# Patient Record
Sex: Male | Born: 1990 | Race: White | Hispanic: No | Marital: Single | Smoking: Never smoker
Health system: Southern US, Community
[De-identification: ages and names within clinical notes are randomized; demographics above are authoritative.]

## PROBLEM LIST (undated history)

## (undated) DIAGNOSIS — F909 Attention-deficit hyperactivity disorder, unspecified type: Secondary | ICD-10-CM

## (undated) HISTORY — PX: EXTERNAL EAR SURGERY: SHX627

---

## 2013-03-17 ENCOUNTER — Encounter (HOSPITAL_BASED_OUTPATIENT_CLINIC_OR_DEPARTMENT_OTHER): Payer: Self-pay | Admitting: Emergency Medicine

## 2013-03-17 ENCOUNTER — Emergency Department (HOSPITAL_BASED_OUTPATIENT_CLINIC_OR_DEPARTMENT_OTHER): Payer: BC Managed Care – PPO

## 2013-03-17 ENCOUNTER — Emergency Department (HOSPITAL_BASED_OUTPATIENT_CLINIC_OR_DEPARTMENT_OTHER)
Admission: EM | Admit: 2013-03-17 | Discharge: 2013-03-17 | Disposition: A | Discharge status: Home / Self Care | Payer: BC Managed Care – PPO | Attending: Emergency Medicine | Admitting: Emergency Medicine

## 2013-03-17 DIAGNOSIS — Y929 Unspecified place or not applicable: Secondary | ICD-10-CM | POA: Insufficient documentation

## 2013-03-17 DIAGNOSIS — X500XXA Overexertion from strenuous movement or load, initial encounter: Secondary | ICD-10-CM | POA: Insufficient documentation

## 2013-03-17 DIAGNOSIS — S93409A Sprain of unspecified ligament of unspecified ankle, initial encounter: Secondary | ICD-10-CM

## 2013-03-17 DIAGNOSIS — Y9389 Activity, other specified: Secondary | ICD-10-CM | POA: Insufficient documentation

## 2013-03-17 DIAGNOSIS — F909 Attention-deficit hyperactivity disorder, unspecified type: Secondary | ICD-10-CM | POA: Insufficient documentation

## 2013-03-17 DIAGNOSIS — Z79899 Other long term (current) drug therapy: Secondary | ICD-10-CM | POA: Insufficient documentation

## 2013-03-17 HISTORY — DX: Attention-deficit hyperactivity disorder, unspecified type: F90.9

## 2013-03-17 MED ORDER — HYDROCODONE-ACETAMINOPHEN 5-325 MG PO TABS: 2.0000 | ORAL_TABLET | ORAL | Status: AC | PRN

## 2013-03-17 NOTE — Discharge Instructions (Signed)

## 2013-03-17 NOTE — ED Provider Notes (Signed)
CSN: [id number]     Arrival date & time 03/17/13  1245 History   First MD Initiated Contact with Patient 03/17/13 1306     Chief Complaint  Patient presents with  . Ankle Injury   HPI  Patient presents after inverting his ankle and onto the step. Felt a pop and has pain and swelling laterally. Walks with a marked limp. No previous ankle injuries.  Past Medical History  Diagnosis Date  . ADHD (attention deficit hyperactivity disorder)    Past Surgical History  Procedure Laterality Date  . External ear surgery     No family history on file. History  Substance Use Topics  . Smoking status: Never Smoker   . Smokeless tobacco: Not on file  . Alcohol Use: No    Review of Systems  Musculoskeletal:       Pain and swelling to the right lateral ankle. No pain in the knee I am lateral fibula. Nontender over the head of the fifth metatarsal.    Allergies  Review of patient's allergies indicates no known allergies.  Home Medications   Current Outpatient Rx  Name  Route  Sig  Dispense  Refill  . amphetamine-dextroamphetamine (ADDERALL) 20 MG tablet   Oral   Take 20 mg by mouth daily.         Marland Kitchen. HYDROcodone-acetaminophen (NORCO/VICODIN) 5-325 MG per tablet   Oral   Take 2 tablets by mouth every 4 (four) hours as needed.   6 tablet   0    BP 144/55  Pulse 72  Temp(Src) 98.2 F (36.8 C) (Oral)  Resp 16  Ht 6\' 3"  (1.905 m)  Wt 220 lb (99.791 kg)  BMI 27.50 kg/m2  SpO2 99% Physical Exam  Musculoskeletal:       Feet:    ED Course  Procedures (including critical care time) Labs Review Labs Reviewed - No data to display Imaging Review Dg Ankle Complete Right  03/17/2013   CLINICAL DATA:  Pain post trauma  EXAM: RIGHT ANKLE - COMPLETE 3+ VIEW  COMPARISON:  None.  FINDINGS: Frontal, oblique, and lateral views were obtained. There is swelling laterally. No fracture or effusion. Ankle mortise appears intact.  IMPRESSION: Swelling laterally.  No fracture.  Mortise intact.    Electronically Signed   By: Bretta BangWilliam  Woodruff M.D.   On: 03/17/2013 13:43    EKG Interpretation   None       MDM   1. Ankle sprain    Soft tissue swelling noted. X-ray shows no fracture. Placed in an Ace wrap. Plan will be non-weight bearing for 2-3 days. Ice elevation Ace wrap anti-inflammatories. Given numbers 6 Vicodin as a prescription for pain today. Slowly increase weightbearing as tolerated. Nonsurgical ortho followup if not improving after one week.   Rolland PorterMark Monae Topping, MD 03/17/13 1400

## 2013-03-17 NOTE — ED Notes (Signed)
Tripped/twisted right ankle approx 10am-swelling noted

## 2013-03-17 NOTE — ED Notes (Signed)
Patient given proper crutches education, patient performed return demonstration.

## 2014-12-20 IMAGING — CR DG ANKLE COMPLETE 3+V*R*
3 series · 3 of 3 positions shown · non-contrast
Comparison: None.

CLINICAL DATA: Pain post trauma

EXAM:
RIGHT ANKLE - COMPLETE 3+ VIEW

[t ankle joint ap right]
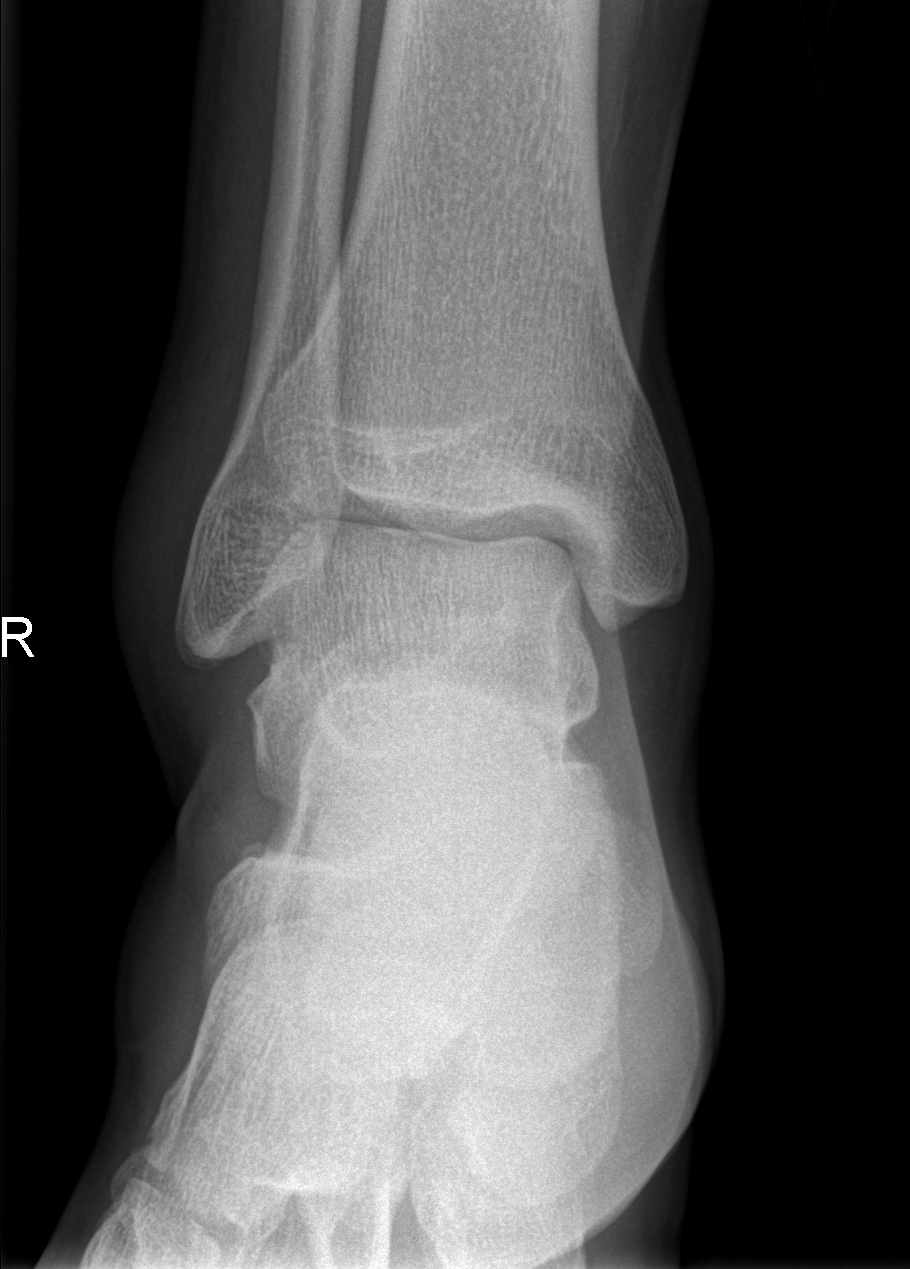

[t ankle joint oblique right]
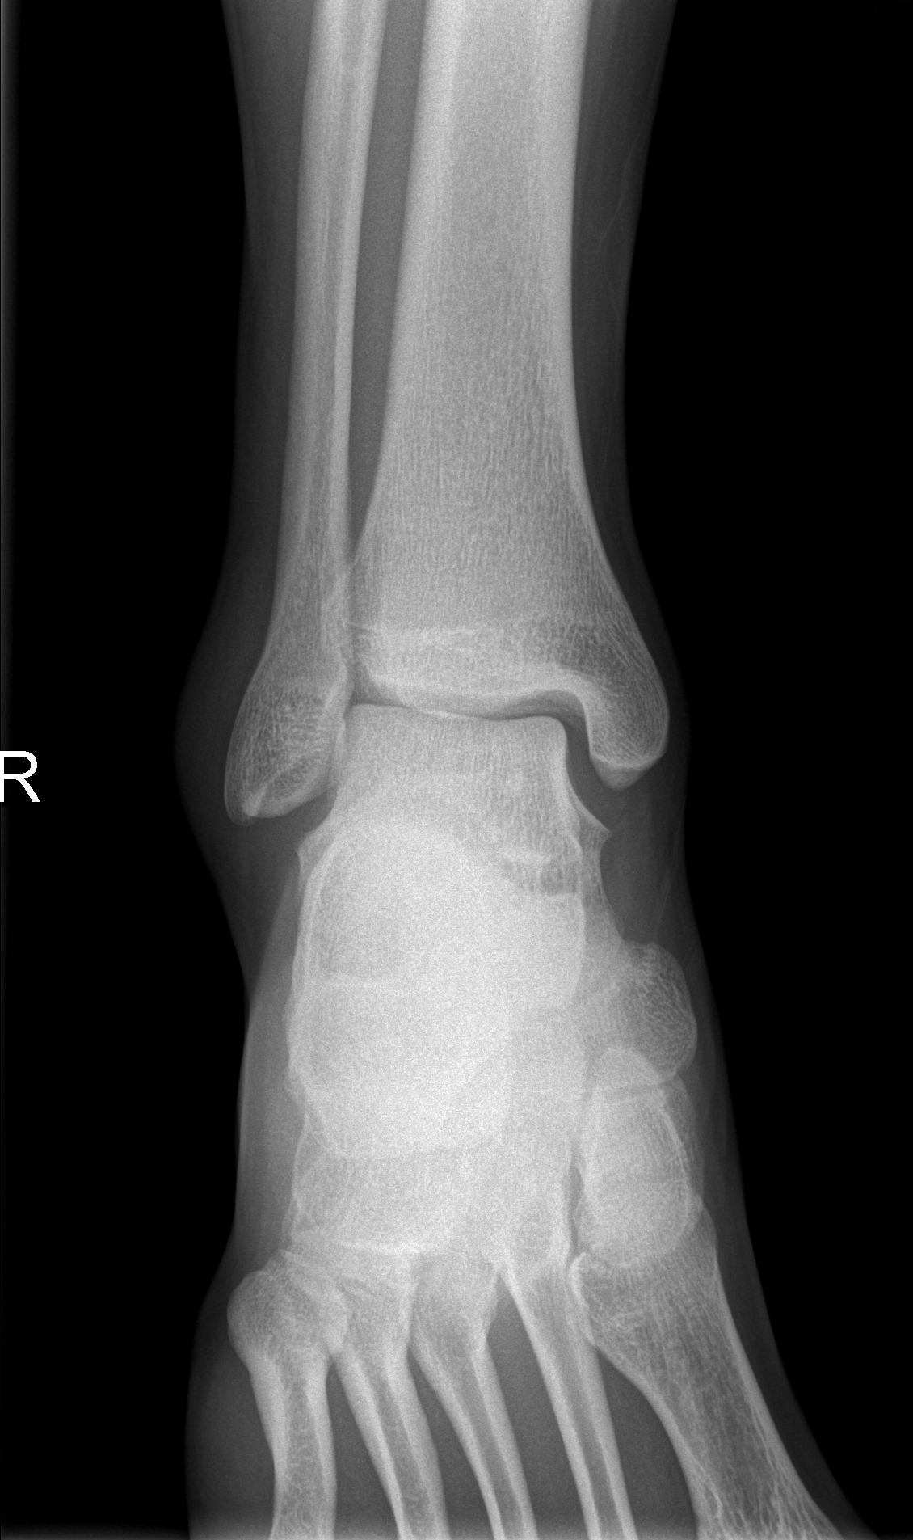

[t ankle joint lat right]
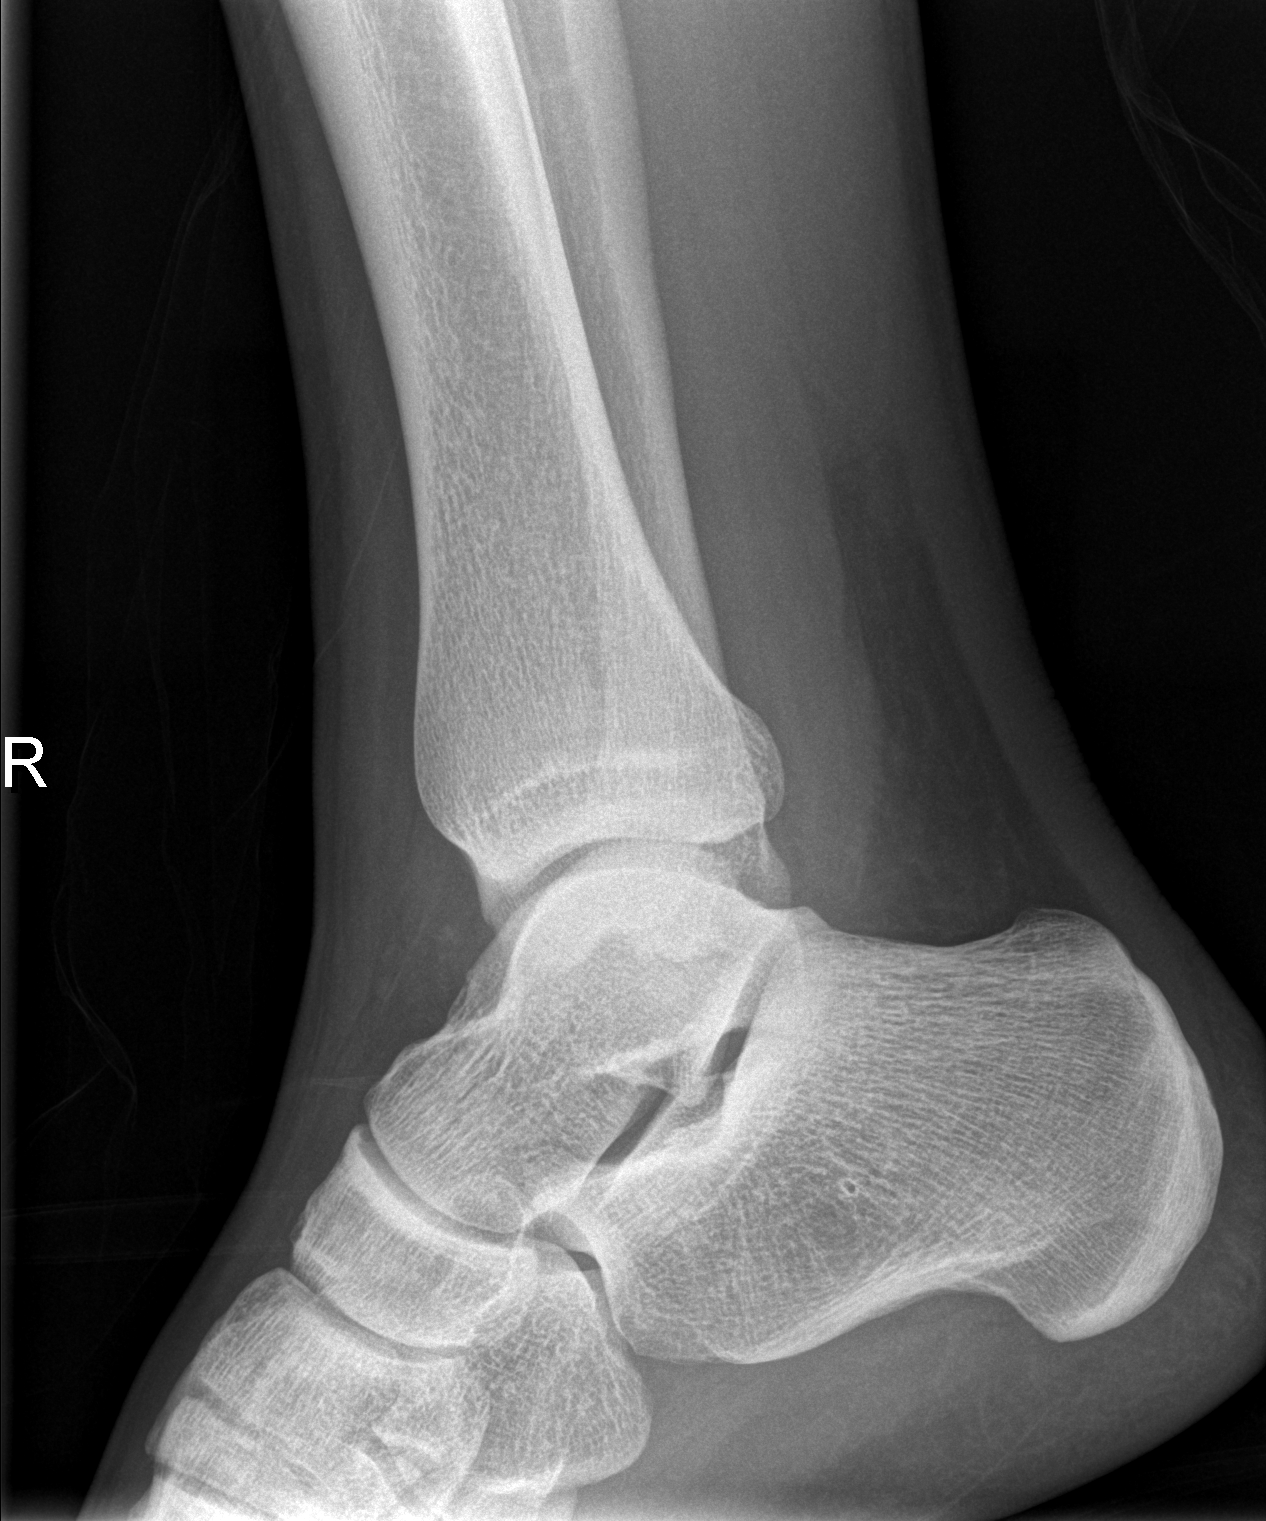

[3 of 3 positions shown; findings below may reference images not displayed]

FINDINGS: Frontal, oblique, and lateral views were obtained. There is swelling
laterally. No fracture or effusion. Ankle mortise appears intact.
IMPRESSION: Swelling laterally.  No fracture.  Mortise intact.
